# Patient Record
Sex: Male | Born: 1995 | Hispanic: No | Marital: Single | Smoking: Current every day smoker
Health system: Southern US, Community
[De-identification: ages and names within clinical notes are randomized; demographics above are authoritative.]

## PROBLEM LIST (undated history)

## (undated) HISTORY — PX: WISDOM TOOTH EXTRACTION: SHX21

---

## 2018-03-05 ENCOUNTER — Encounter (HOSPITAL_COMMUNITY): Payer: Self-pay

## 2018-03-05 ENCOUNTER — Ambulatory Visit (HOSPITAL_COMMUNITY)
Admission: EM | Admit: 2018-03-05 | Discharge: 2018-03-05 | Disposition: A | Discharge status: Home / Self Care | Payer: Medicaid Other | Attending: Family Medicine | Admitting: Family Medicine

## 2018-03-05 DIAGNOSIS — J00 Acute nasopharyngitis [common cold]: Secondary | ICD-10-CM

## 2018-03-05 MED ORDER — TRIAMCINOLONE ACETONIDE 55 MCG/ACT NA AERO: 2.0000 | INHALATION_SPRAY | Freq: Every day | NASAL | 0 refills | Status: DC

## 2018-03-05 NOTE — Discharge Instructions (Addendum)
Drink plenty of fluids Use Nasacort for the sinus and nasal congestion Expect improvement in a couple days

## 2018-03-05 NOTE — ED Provider Notes (Signed)
[facility]    CSN: [id number] Arrival date & time: 03/05/18  1758     History   Chief Complaint Chief Complaint  Patient presents with  . Fever  . Nasal Congestion    HPI Randall Hendrix is a 23 y.o. male.   HPI  Patient has a cold since yesterday.  Runny and stuffy nose.  Some headache.  No fever chills.  No cough.  No sinus pressure pain.  Clear runny nose drainage.  He tried Tylenol which did not help.  He is here because he said he did not go to work yesterday today and he needs a work note.  History reviewed. No pertinent past medical history.  There are no active problems to display for this patient.   History reviewed. No pertinent surgical history.     Home Medications    Prior to Admission medications   Medication Sig Start Date End Date Taking? Authorizing Provider  triamcinolone (NASACORT) 55 MCG/ACT AERO nasal inhaler Place 2 sprays into the nose daily. 03/05/18   Eustace Moore, MD    Family History Family History  Family history unknown: Yes    Social History Social History   Tobacco Use  . Smoking status: Current Every Day Smoker  . Smokeless tobacco: Current User  Substance Use Topics  . Alcohol use: Never    Frequency: Never  . Drug use: Never     Allergies   Patient has no known allergies.   Review of Systems Review of Systems  Constitutional: Negative for chills and fever.  HENT: Positive for congestion, postnasal drip and rhinorrhea. Negative for ear pain and sore throat.   Eyes: Negative for pain and visual disturbance.  Respiratory: Negative for cough and shortness of breath.   Cardiovascular: Negative for chest pain and palpitations.  Gastrointestinal: Negative for abdominal pain and vomiting.  Genitourinary: Negative for dysuria and hematuria.  Musculoskeletal: Negative for arthralgias and back pain.  Skin: Negative for color change and rash.  Neurological: Negative for seizures and syncope.  All other  systems reviewed and are negative.    Physical Exam Triage Vital Signs ED Triage Vitals  Enc Vitals Group     BP 03/05/18 1817 129/65     Pulse Rate 03/05/18 1817 69     Resp 03/05/18 1817 18     Temp 03/05/18 1817 98.2 F (36.8 C)     Temp Source 03/05/18 1817 Skin     SpO2 03/05/18 1817 100 %     Weight --      Height --      Head Circumference --      Peak Flow --      Pain Score 03/05/18 1818 7     Pain Loc --      Pain Edu? --      Excl. in GC? --    No data found.  Updated Vital Signs BP 129/65 (BP Location: Right Arm)   Pulse 69   Temp 98.2 F (36.8 C) (Skin)   Resp 18   SpO2 100%   Visual Acuity Right Eye Distance:   Left Eye Distance:   Bilateral Distance:    Right Eye Near:   Left Eye Near:    Bilateral Near:     Physical Exam Constitutional:      General: He is not in acute distress.    Appearance: He is well-developed.  HENT:     Head: Normocephalic and atraumatic.     Right Ear:  Tympanic membrane, ear canal and external ear normal.     Left Ear: Tympanic membrane, ear canal and external ear normal.     Nose: Congestion and rhinorrhea present.     Comments: Clear rhinorrhea    Mouth/Throat:     Mouth: Mucous membranes are moist.     Pharynx: Oropharynx is clear. No posterior oropharyngeal erythema.  Eyes:     Conjunctiva/sclera: Conjunctivae normal.     Pupils: Pupils are equal, round, and reactive to light.  Neck:     Musculoskeletal: Normal range of motion.  Cardiovascular:     Rate and Rhythm: Normal rate and regular rhythm.     Heart sounds: Normal heart sounds.  Pulmonary:     Effort: Pulmonary effort is normal. No respiratory distress.     Breath sounds: Normal breath sounds.  Abdominal:     General: There is no distension.     Palpations: Abdomen is soft.  Musculoskeletal: Normal range of motion.  Lymphadenopathy:     Cervical: No cervical adenopathy.  Skin:    General: Skin is warm and dry.  Neurological:     Mental  Status: He is alert.  Psychiatric:        Mood and Affect: Mood normal.        Behavior: Behavior normal.      UC Treatments / Results  Labs (all labs ordered are listed, but only abnormal results are displayed) Labs Reviewed - No data to display  EKG None  Radiology No results found.  Procedures Procedures (including critical care time)  Medications Ordered in UC Medications - No data to display  Initial Impression / Assessment and Plan / UC Course  I have reviewed the triage vital signs and the nursing notes.  Pertinent labs & imaging results that were available during my care of the patient were reviewed by me and considered in my medical decision making (see chart for details).      Final Clinical Impressions(s) / UC Diagnoses   Final diagnoses:  Common cold virus     Discharge Instructions     Drink plenty of fluids Use Nasacort for the sinus and nasal congestion Expect improvement in a couple days    ED Prescriptions    Medication Sig Dispense Auth. Provider   triamcinolone (NASACORT) 55 MCG/ACT AERO nasal inhaler Place 2 sprays into the nose daily. 1 Inhaler Eustace Moore, MD     Controlled Substance Prescriptions [place] Controlled Substance Registry consulted? Not Applicable   Eustace Moore, MD 03/05/18 1950

## 2018-03-05 NOTE — ED Triage Notes (Signed)
Pt present fever and nasal drainage, symptoms started yesterday. Pt states he tried OTC medication with no relief

## 2018-08-03 ENCOUNTER — Emergency Department (HOSPITAL_COMMUNITY): Payer: Medicaid Other

## 2018-08-03 ENCOUNTER — Encounter (HOSPITAL_COMMUNITY): Payer: Self-pay

## 2018-08-03 ENCOUNTER — Other Ambulatory Visit: Payer: Self-pay

## 2018-08-03 ENCOUNTER — Emergency Department (HOSPITAL_COMMUNITY)
Admission: EM | Admit: 2018-08-03 | Discharge: 2018-08-03 | Disposition: A | Discharge status: Home / Self Care | Payer: Medicaid Other | Attending: Emergency Medicine | Admitting: Emergency Medicine

## 2018-08-03 DIAGNOSIS — M7918 Myalgia, other site: Secondary | ICD-10-CM

## 2018-08-03 DIAGNOSIS — S60222A Contusion of left hand, initial encounter: Secondary | ICD-10-CM | POA: Insufficient documentation

## 2018-08-03 DIAGNOSIS — Y929 Unspecified place or not applicable: Secondary | ICD-10-CM | POA: Diagnosis not present

## 2018-08-03 DIAGNOSIS — Z79899 Other long term (current) drug therapy: Secondary | ICD-10-CM | POA: Diagnosis not present

## 2018-08-03 DIAGNOSIS — M79642 Pain in left hand: Secondary | ICD-10-CM

## 2018-08-03 DIAGNOSIS — S6992XA Unspecified injury of left wrist, hand and finger(s), initial encounter: Secondary | ICD-10-CM | POA: Diagnosis present

## 2018-08-03 DIAGNOSIS — Y999 Unspecified external cause status: Secondary | ICD-10-CM | POA: Diagnosis not present

## 2018-08-03 DIAGNOSIS — F17290 Nicotine dependence, other tobacco product, uncomplicated: Secondary | ICD-10-CM | POA: Diagnosis not present

## 2018-08-03 DIAGNOSIS — M25551 Pain in right hip: Secondary | ICD-10-CM | POA: Insufficient documentation

## 2018-08-03 DIAGNOSIS — Y9389 Activity, other specified: Secondary | ICD-10-CM | POA: Diagnosis not present

## 2018-08-03 DIAGNOSIS — W228XXA Striking against or struck by other objects, initial encounter: Secondary | ICD-10-CM | POA: Diagnosis not present

## 2018-08-03 NOTE — Discharge Instructions (Addendum)
You have been diagnosed today with musculoskeletal right hip and left hand pain.  At this time there does not appear to be the presence of an emergent medical condition, however there is always the potential for conditions to change. Please read and follow the below instructions.  Please return to the Emergency Department immediately for any new or worsening symptoms. Please be sure to follow up with your Primary Care Provider within one week regarding your visit today; please call their office to schedule an appointment even if you are feeling better for a follow-up visit. You may use over-the-counter Tylenol and ibuprofen as directed on the packaging to help with your musculoskeletal pain.  Please use rest, ice and elevation to help with your symptoms.  Your x-rays today were reassuring however unseen fractures may be present, additionally sprains and strains of ligaments and tendons may also be present.  If your symptoms do not improve within the next few days please call the orthopedic specialist Dr. Lorin Mercy to schedule a follow-up appointment for further evaluation as further imaging may be necessary.   Get help right away if: Your hand becomes warm, red, or swollen. Your hand is numb or tingling. Your hand is extremely swollen or deformed. Your hand or fingers turn white or blue. You cannot move your hand, wrist, or fingers. You fall. You have a sudden increase in pain and swelling in your hip. Your hip is red or swollen or very tender to touch. Any new/concerning or worsening symptoms.  Please read the additional information packets attached to your discharge summary.
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
## 2018-08-10 ENCOUNTER — Ambulatory Visit: Payer: Medicaid Other | Admitting: Orthopaedic Surgery

## 2018-08-17 ENCOUNTER — Ambulatory Visit: Payer: Medicaid Other | Admitting: Orthopaedic Surgery

## 2018-11-04 ENCOUNTER — Other Ambulatory Visit: Payer: Self-pay

## 2018-11-04 DIAGNOSIS — Z20822 Contact with and (suspected) exposure to covid-19: Secondary | ICD-10-CM

## 2018-11-07 LAB — NOVEL CORONAVIRUS, NAA: SARS-CoV-2, NAA: NOT DETECTED

## 2019-09-22 ENCOUNTER — Ambulatory Visit (HOSPITAL_COMMUNITY)
Admission: RE | Admit: 2019-09-22 | Discharge: 2019-09-22 | Disposition: A | Discharge status: Home / Self Care | Payer: Medicaid Other | Source: Ambulatory Visit | Attending: Family Medicine | Admitting: Family Medicine

## 2019-09-22 ENCOUNTER — Encounter (HOSPITAL_COMMUNITY): Payer: Self-pay

## 2019-09-22 ENCOUNTER — Other Ambulatory Visit: Payer: Self-pay

## 2019-09-22 VITALS — BP 112/56 | HR 77 | Temp 98.7°F | Resp 16

## 2019-09-22 DIAGNOSIS — H6503 Acute serous otitis media, bilateral: Secondary | ICD-10-CM

## 2019-09-22 MED ORDER — PREDNISONE 50 MG PO TABS: ORAL_TABLET | ORAL | 0 refills | Status: DC

## 2019-09-22 NOTE — Discharge Instructions (Signed)
If your ear pressure and pain does not resolve or continues to come back, you may try flonase or nasacort nasal spray 1-2 times daily as needed to help

## 2019-09-22 NOTE — ED Triage Notes (Signed)
Pt c/o bilateral ear pain x 3 days. Pt states it is a sharp shooting pain and is worse when he is lying down.

## 2019-09-22 NOTE — ED Provider Notes (Signed)
[facility]    CSN: [id number] Arrival date & time: 09/22/19  1745      History   Chief Complaint Chief Complaint  Patient presents with  . Appointment  . Otalgia    HPI Randall Hendrix is a 24 y.o. male.   Here today with several days of worsening ear pressure and sharp shooting intermittent pains. Denies muffled hearing, congestion, sore throat, fever, chills. Notes this has happened occasionally to him in the past, unsure what the trigger is or what he's done to resolve the issue. Denies hx of seasonal allergies or past ear infections. Not trying anything at home for sxs.      History reviewed. No pertinent past medical history.  There are no problems to display for this patient.   Past Surgical History:  Procedure Laterality Date  . WISDOM TOOTH EXTRACTION         Home Medications    Prior to Admission medications   Medication Sig Start Date End Date Taking? Authorizing Provider  predniSONE (DELTASONE) 50 MG tablet Take 1 tablet daily with breakfast for 3 days 09/22/19   Particia Nearing, PA-C  triamcinolone (NASACORT) 55 MCG/ACT AERO nasal inhaler Place 2 sprays into the nose daily. 03/05/18   Eustace Moore, MD    Family History Family History  Family history unknown: Yes    Social History Social History   Tobacco Use  . Smoking status: Current Every Day Smoker    Packs/day: 0.50    Types: Cigarettes  . Smokeless tobacco: Current User  Vaping Use  . Vaping Use: Former  Substance Use Topics  . Alcohol use: Never  . Drug use: Never     Allergies   Patient has no known allergies.   Review of Systems Review of Systems PER HPI   Physical Exam Triage Vital Signs ED Triage Vitals  Enc Vitals Group     BP 09/22/19 1822 (!) 112/56     Pulse Rate 09/22/19 1822 77     Resp 09/22/19 1822 16     Temp 09/22/19 1822 98.7 F (37.1 C)     Temp Source 09/22/19 1822 Oral     SpO2 09/22/19 1822 98 %     Weight --      Height  --      Head Circumference --      Peak Flow --      Pain Score 09/22/19 1818 7     Pain Loc --      Pain Edu? --      Excl. in GC? --    No data found.  Updated Vital Signs BP (!) 112/56 (BP Location: Left Arm)   Pulse 77   Temp 98.7 F (37.1 C) (Oral)   Resp 16   SpO2 98%   Visual Acuity Right Eye Distance:   Left Eye Distance:   Bilateral Distance:    Right Eye Near:   Left Eye Near:    Bilateral Near:     Physical Exam Vitals and nursing note reviewed.  Constitutional:      Appearance: Normal appearance.  HENT:     Head: Atraumatic.     Right Ear: Ear canal normal.     Left Ear: Ear canal normal.     Ears:     Comments: B/l middle ear effusions, no TM erythema, injection, purulent fluid    Nose: Nose normal. No congestion.     Mouth/Throat:     Mouth: Mucous membranes are  moist.     Pharynx: Oropharynx is clear. No posterior oropharyngeal erythema.  Eyes:     Extraocular Movements: Extraocular movements intact.     Conjunctiva/sclera: Conjunctivae normal.  Cardiovascular:     Rate and Rhythm: Normal rate and regular rhythm.  Pulmonary:     Effort: Pulmonary effort is normal.     Breath sounds: Normal breath sounds.  Musculoskeletal:        General: Normal range of motion.     Cervical back: Normal range of motion and neck supple.  Skin:    General: Skin is warm and dry.  Neurological:     General: No focal deficit present.     Mental Status: He is oriented to person, place, and time.  Psychiatric:        Mood and Affect: Mood normal.        Thought Content: Thought content normal.        Judgment: Judgment normal.      UC Treatments / Results  Labs (all labs ordered are listed, but only abnormal results are displayed) Labs Reviewed - No data to display  EKG   Radiology No results found.  Procedures Procedures (including critical care time)  Medications Ordered in UC Medications - No data to display  Initial Impression / Assessment  and Plan / UC Course  I have reviewed the triage vital signs and the nursing notes.  Pertinent labs & imaging results that were available during my care of the patient were reviewed by me and considered in my medical decision making (see chart for details).     Suspect eustachian tube dysfunction, will tx with short prednisone burst and start flonase twice daily. Follow up if issue worsens or does not resolve.   Final Clinical Impressions(s) / UC Diagnoses   Final diagnoses:  Non-recurrent acute serous otitis media of both ears     Discharge Instructions     If your ear pressure and pain does not resolve or continues to come back, you may try flonase or nasacort nasal spray 1-2 times daily as needed to help    ED Prescriptions    Medication Sig Dispense Auth. Provider   predniSONE (DELTASONE) 50 MG tablet Take 1 tablet daily with breakfast for 3 days 3 tablet Particia Nearing, [place]     PDMP not reviewed this encounter.   Particia Nearing, [place] 09/22/19 1850

## 2020-01-24 ENCOUNTER — Emergency Department (HOSPITAL_COMMUNITY)
Admission: EM | Admit: 2020-01-24 | Discharge: 2020-01-24 | Disposition: A | Discharge status: Home / Self Care | Payer: Medicaid Other | Attending: Emergency Medicine | Admitting: Emergency Medicine

## 2020-01-24 ENCOUNTER — Encounter (HOSPITAL_COMMUNITY): Payer: Self-pay

## 2020-01-24 ENCOUNTER — Other Ambulatory Visit: Payer: Self-pay

## 2020-01-24 DIAGNOSIS — M5489 Other dorsalgia: Secondary | ICD-10-CM | POA: Diagnosis not present

## 2020-01-24 DIAGNOSIS — Z5321 Procedure and treatment not carried out due to patient leaving prior to being seen by health care provider: Secondary | ICD-10-CM | POA: Diagnosis not present

## 2020-01-24 NOTE — ED Notes (Signed)
Called with no answer 

## 2020-01-24 NOTE — ED Triage Notes (Addendum)
Pt presents with c/o right upper back pain around the scapula area. Pt denies any injury leading to the pain. Pt reports pain is 8/10. Pt reports pain started 3 days ago.

## 2020-08-28 ENCOUNTER — Encounter (HOSPITAL_COMMUNITY): Payer: Self-pay

## 2020-08-28 ENCOUNTER — Emergency Department (HOSPITAL_COMMUNITY)
Admission: EM | Admit: 2020-08-28 | Discharge: 2020-08-28 | Disposition: A | Discharge status: Home / Self Care | Payer: Medicaid Other | Attending: Emergency Medicine | Admitting: Emergency Medicine

## 2020-08-28 ENCOUNTER — Other Ambulatory Visit: Payer: Self-pay

## 2020-08-28 DIAGNOSIS — Y9389 Activity, other specified: Secondary | ICD-10-CM | POA: Diagnosis not present

## 2020-08-28 DIAGNOSIS — F17210 Nicotine dependence, cigarettes, uncomplicated: Secondary | ICD-10-CM | POA: Diagnosis not present

## 2020-08-28 DIAGNOSIS — W268XXA Contact with other sharp object(s), not elsewhere classified, initial encounter: Secondary | ICD-10-CM | POA: Insufficient documentation

## 2020-08-28 DIAGNOSIS — Y990 Civilian activity done for income or pay: Secondary | ICD-10-CM | POA: Diagnosis not present

## 2020-08-28 DIAGNOSIS — S6992XA Unspecified injury of left wrist, hand and finger(s), initial encounter: Secondary | ICD-10-CM | POA: Diagnosis present

## 2020-08-28 DIAGNOSIS — Z23 Encounter for immunization: Secondary | ICD-10-CM | POA: Insufficient documentation

## 2020-08-28 DIAGNOSIS — S61012A Laceration without foreign body of left thumb without damage to nail, initial encounter: Secondary | ICD-10-CM | POA: Diagnosis not present

## 2020-08-28 MED ORDER — TETANUS-DIPHTH-ACELL PERTUSSIS 5-2.5-18.5 LF-MCG/0.5 IM SUSY
0.5000 mL | PREFILLED_SYRINGE | Freq: Once | INTRAMUSCULAR | Status: AC
  Administered 2020-08-28: 0.5 mL via INTRAMUSCULAR
  Filled 2020-08-28: qty 0.5

## 2020-08-28 MED ORDER — LIDOCAINE HCL (PF) 1 % IJ SOLN
10.0000 mL | Freq: Once | INTRAMUSCULAR | Status: AC
  Administered 2020-08-28: 10 mL
  Filled 2020-08-28: qty 30

## 2020-08-28 MED ORDER — BACITRACIN ZINC 500 UNIT/GM EX OINT
TOPICAL_OINTMENT | Freq: Two times a day (BID) | CUTANEOUS | Status: DC
  Administered 2020-08-28: 1 via TOPICAL
  Filled 2020-08-28: qty 0.9

## 2020-08-28 NOTE — ED Provider Notes (Signed)
[facility]-EMERGENCY DEPT Provider Note   CSN: [id number] Arrival date & time: 08/28/20  0524     History Chief Complaint  Patient presents with   Thumb Laceration    Randall Hendrix is a 25 y.o. 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 The patient pulled the thumb away quickly and sheared the area. He denies pain with movement of the extremity and reports a mild numbness at the site of injury. The patient does not know when his last tetanus shot was.  HPI     History reviewed. No pertinent past medical history.  There are no problems to display for this patient.   Past Surgical History:  Procedure Laterality Date   WISDOM TOOTH EXTRACTION         Family History  Family history unknown: Yes    Social History   Tobacco Use   Smoking status: Every Day    Packs/day: 0.50    Types: Cigarettes   Smokeless tobacco: Current  Vaping Use   Vaping Use: Former  Substance Use Topics   Alcohol use: Never   Drug use: Never    Home Medications Prior to Admission medications   Medication Sig Start Date End Date Taking? Authorizing Provider  predniSONE (DELTASONE) 50 MG tablet Take 1 tablet daily with breakfast for 3 days 09/22/19   Particia Nearing, PA-C  triamcinolone (NASACORT) 55 MCG/ACT AERO nasal inhaler Place 2 sprays into the nose daily. 03/05/18   Eustace Moore, MD    Allergies    Patient has no known allergies.  Review of Systems   Review of Systems  Constitutional:  Negative for fever.  Musculoskeletal:  Negative for joint swelling.  Skin:  Positive for wound.  Hematological:  Does not bruise/bleed easily.  All other systems reviewed and are negative.  Physical Exam Updated Vital Signs BP (!) 153/72 (BP Location: Left Arm)   Pulse 85   Temp 97.8 F (36.6 C) (Oral)   Resp 18   SpO2 99%   Physical Exam Vitals  and nursing note reviewed.  Constitutional:      General: He is not in acute distress.    Appearance: Normal appearance.  HENT:     Head: Normocephalic and atraumatic.  Eyes:     General:        Right eye: No discharge.        Left eye: No discharge.  Cardiovascular:     Rate and Rhythm: Normal rate and regular rhythm.     Heart sounds: No murmur heard.   No friction rub. No gallop.  Pulmonary:     Effort: Pulmonary effort is normal.     Breath sounds: Normal breath sounds.  Musculoskeletal:     Comments: Passive and active ROM of left thumb grossly intact. Strength of thumb intact, 5/5.   Skin:    General: Skin is warm and dry.     Capillary Refill: Capillary refill takes less than 2 seconds.     Comments: 2 cm laceration on the thumb which is linear and well approximated  Neurological:     Mental Status: He is alert and oriented to person, place, and time.     Sensory: No sensory deficit.  Psychiatric:        Mood and Affect: Mood normal.        Behavior: Behavior normal.  ED Results / Procedures / Treatments   Labs (all labs ordered are listed, but only abnormal results are displayed) Labs Reviewed - No data to display  EKG None  Radiology No results found.  Procedures .Marland KitchenLaceration Repair  Date/Time: 08/28/2020 6:28 AM Performed by: Olene Floss, PA-C Authorized by: Olene Floss, PA-C   Consent:    Consent obtained:  Verbal   Consent given by:  Patient   Risks, benefits, and alternatives were discussed: yes     Risks discussed:  Infection, poor cosmetic result and pain   Alternatives discussed:  No treatment Universal protocol:    Procedure explained and questions answered to patient or proxy's satisfaction: yes     Patient identity confirmed:  Verbally with patient Anesthesia:    Anesthesia method:  Nerve block   Block location:  Ring block left thumb   Block needle gauge:  25 G   Block anesthetic:  Lidocaine 1% w/o epi   Block  injection procedure:  Anatomic landmarks identified, introduced needle and negative aspiration for blood   Block outcome:  Anesthesia achieved Laceration details:    Location: ventral left thumb.   Length (cm):  2   Depth (mm):  2 Exploration:    Hemostasis achieved with:  Direct pressure   Wound exploration: entire depth of wound visualized     Contaminated: no   Treatment:    Area cleansed with:  Saline   Amount of cleaning:  Standard   Irrigation solution:  Sterile saline   Debridement:  None   Undermining:  None Skin repair:    Repair method:  Sutures   Suture size:  4-0   Suture material:  Prolene   Suture technique:  Simple interrupted   Number of sutures:  3 Approximation:    Approximation:  Close Repair type:    Repair type:  Simple Post-procedure details:    Dressing:  Non-adherent dressing   Procedure completion:  Tolerated   Medications Ordered in ED Medications  lidocaine (PF) (XYLOCAINE) 1 % injection 10 mL (has no administration in time range)  Tdap (BOOSTRIX) injection 0.5 mL (has no administration in time range)    ED Course  I have reviewed the triage vital signs and the nursing notes.  Pertinent labs & imaging results that were available during my care of the patient were reviewed by me and considered in my medical decision making (see chart for details).    MDM Rules/Calculators/A&P                         Wound fully visualized and no foreign bodies retained. Patient with full ROM and neurovascularly intact. Lac repaired as detailed in the note above. Hemostasis achieved with direct pressure and sutures. Bacitracin and bandage applied. Tetanus updated. Final Clinical Impression(s) / ED Diagnoses Final diagnoses:  None    Rx / DC Orders ED Discharge Orders     None        [place] 08/28/20 0881    Marily Memos, MD 08/29/20 [phone]

## 2020-08-28 NOTE — ED Notes (Signed)
Wound irrigation provided via sterile saline and cleanser. 4x4 Gauze applied to wound site.

## 2020-08-28 NOTE — Discharge Instructions (Signed)
Please return in 10-14 for suture removal. Please return for re-evaluation if the wound becomes hot, exquisitely tender, or has pus draining from the site. Keep bandaged +/- antibiotic ointment especially while at work.

## 2020-08-28 NOTE — ED Triage Notes (Signed)
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

## 2020-08-28 NOTE — ED Provider Notes (Signed)
Pt seen in conjunction with C Prosperi, PA-C. Please see his notes for full history, exam, and plan  In brief, patient presented for evaluation of left palm laceration which occurred just prior to arrival.  Tetanus is not up-to-date.  On exam, he is neurovascularly intact. Repaired with sutures.   Alveria Apley, PA-C 08/28/20 1855    Marily Memos, MD 08/28/20 [phone]

## 2020-08-28 NOTE — ED Notes (Signed)
PT ambulatory in ED lobby. 

## 2020-09-03 ENCOUNTER — Encounter (HOSPITAL_BASED_OUTPATIENT_CLINIC_OR_DEPARTMENT_OTHER): Payer: Self-pay

## 2020-09-04 ENCOUNTER — Ambulatory Visit: Payer: Medicaid Other | Admitting: Family Medicine

## 2020-10-15 ENCOUNTER — Ambulatory Visit (HOSPITAL_BASED_OUTPATIENT_CLINIC_OR_DEPARTMENT_OTHER): Payer: Medicaid Other | Admitting: Family Medicine
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
## 2021-05-18 ENCOUNTER — Other Ambulatory Visit: Payer: Self-pay

## 2021-05-18 ENCOUNTER — Emergency Department (HOSPITAL_BASED_OUTPATIENT_CLINIC_OR_DEPARTMENT_OTHER)
Admission: EM | Admit: 2021-05-18 | Discharge: 2021-05-19 | Disposition: A | Discharge status: Home / Self Care | Payer: Medicaid Other | Attending: Emergency Medicine | Admitting: Emergency Medicine

## 2021-05-18 DIAGNOSIS — K644 Residual hemorrhoidal skin tags: Secondary | ICD-10-CM | POA: Diagnosis not present

## 2021-05-18 DIAGNOSIS — R0789 Other chest pain: Secondary | ICD-10-CM

## 2021-05-18 DIAGNOSIS — L03115 Cellulitis of right lower limb: Secondary | ICD-10-CM | POA: Insufficient documentation

## 2021-05-18 DIAGNOSIS — J302 Other seasonal allergic rhinitis: Secondary | ICD-10-CM

## 2021-05-18 DIAGNOSIS — K649 Unspecified hemorrhoids: Secondary | ICD-10-CM

## 2021-05-18 DIAGNOSIS — K6289 Other specified diseases of anus and rectum: Secondary | ICD-10-CM | POA: Diagnosis present

## 2021-05-19 ENCOUNTER — Other Ambulatory Visit: Payer: Self-pay

## 2021-05-19 ENCOUNTER — Encounter (HOSPITAL_BASED_OUTPATIENT_CLINIC_OR_DEPARTMENT_OTHER): Payer: Self-pay | Admitting: *Deleted

## 2021-05-19 ENCOUNTER — Emergency Department (HOSPITAL_BASED_OUTPATIENT_CLINIC_OR_DEPARTMENT_OTHER): Payer: Medicaid Other | Admitting: Radiology

## 2021-05-19 MED ORDER — DOXYCYCLINE HYCLATE 100 MG PO CAPS: 100.0000 mg | ORAL_CAPSULE | Freq: Two times a day (BID) | ORAL | 0 refills | Status: AC

## 2021-05-19 NOTE — ED Triage Notes (Addendum)
Pt c/o of right anterior chest pain that started one day ago. States fevers on and off times one week. Describes as a sharp pain. States pain worse with cough. Has been taking benadryl without relief. Pt also c/o rectal pain/swelling to rectal area that started today. States pain in rectal area worse with cough. Denies any sob.  ?

## 2021-05-19 NOTE — ED Notes (Signed)
Pt requesting MD return to eval R leg -- concerned for abscess to RLE that he feels might need to be drained - Dr Bernette Mayers notified via secure chat; awaiting reply  ?

## 2021-05-19 NOTE — ED Notes (Signed)
Dr Karle Starch back to bedside to re-eval patient re: pt concern for possible abscess -- mild redness noted to upper medial thigh region with small ingrown hair -- provider to update d/c instructions to include ABX prescription.  ?

## 2021-05-19 NOTE — ED Notes (Signed)
Pt agreeable with d/c plan as discussed by provider- this nurse has verbally reinforced d/c instructions and provided pt with written copy- pt acknowledges verbal understanding and denies any additional questions, concerns, needs- ambulatory at d/c independently with steady gait; no distress; vitals stable.  ?

## 2021-05-19 NOTE — ED Notes (Signed)
Patient transported to X-ray via w/c escorted by Radiology tech  ?

## 2021-05-19 NOTE — ED Provider Notes (Addendum)
? ?[facility] EMERGENCY DEPT  ?Provider Note ? ?CSN: [id number] ?Arrival date & time: 05/18/21 2354 ? ?History ?Chief Complaint  ?Patient presents with  ? Rectal Pain  ? ? ?Randall Hendrix is a 26 y.o. male here for two complaints. His first is that he has been having rectal pain for the last 12 hours or so. He had a normal BM yesterday. Has not had a BM today. No bleeding, no hard stools. Pain is worse when coughing.  ? ?He also reports he has had seasonal allergies with a cough recently. No fevers but he has been feeling generally weak. He reports some pain in chest which is worse with coughing.  ? ? ?Home Medications ?Prior to Admission medications   ?Medication Sig Start Date End Date Taking? Authorizing Provider  ?doxycycline (VIBRAMYCIN) 100 MG capsule Take 1 capsule (100 mg total) by mouth 2 (two) times daily. 05/19/21  Yes Pollyann Savoy, MD  ? ? ? ?Allergies    ?Patient has no known allergies. ? ? ?Review of Systems   ?Review of Systems ?Please see HPI for pertinent positives and negatives ? ?Physical Exam ?BP 124/72   Pulse 70   Temp 98.4 ?F (36.9 ?C) (Oral)   Resp (!) 21   Ht 5\' 11"  (1.803 m)   Wt 76.2 kg   SpO2 98%   BMI 23.43 kg/m?  ? ?Physical Exam ?Vitals and nursing note reviewed.  ?Constitutional:   ?   Appearance: Normal appearance.  ?HENT:  ?   Head: Normocephalic and atraumatic.  ?   Nose: Nose normal.  ?   Mouth/Throat:  ?   Mouth: Mucous membranes are moist.  ?Eyes:  ?   Extraocular Movements: Extraocular movements intact.  ?   Conjunctiva/sclera: Conjunctivae normal.  ?Cardiovascular:  ?   Rate and Rhythm: Normal rate.  ?Pulmonary:  ?   Effort: Pulmonary effort is normal.  ?   Breath sounds: Normal breath sounds.  ?Chest:  ?   Chest wall: Tenderness (R chest wall) present.  ?Abdominal:  ?   General: Abdomen is flat.  ?   Palpations: Abdomen is soft.  ?   Tenderness: There is no abdominal tenderness.  ?Genitourinary: ?   Comments: Chaperon present. There is a moderate R  sided non-thrombosed non-bleeding external hemorrhoid, no fissure, no fluctuant mass/abscess.  ?Musculoskeletal:     ?   General: No swelling. Normal range of motion.  ?   Cervical back: Neck supple.  ?Skin: ?   General: Skin is warm and dry.  ?Neurological:  ?   General: No focal deficit present.  ?   Mental Status: He is alert.  ?Psychiatric:     ?   Mood and Affect: Mood normal.  ? ? ?ED Results / Procedures / Treatments   ?EKG ?EKG Interpretation ? ?Date/Time:  Sunday May 19 2021 00:23:56 EDT ?Ventricular Rate:  75 ?PR Interval:  162 ?QRS Duration: 92 ?QT Interval:  388 ?QTC Calculation: 434 ?R Axis:   203 ?Text Interpretation: Sinus rhythm ST elev, probable normal early repol pattern No old tracing to compare Confirmed by [id number]) on 05/19/2021 12:35:36 AM ? ?Procedures ?Procedures ? ?Medications Ordered in the ED ?Medications - No data to display ? ?Initial Impression and Plan ? Patient here with rectal pain from a hemorrhoid. Will treat conservatively with OTC ointments, stool softeners and outpatient follow up.  ?For his cough with chest pain, check EKG and CXR. Symptoms most consistent with a MSK problem, no concern  for ACS in this healthy 25 year old.  ? ?ED Course  ? ?Clinical Course as of 05/19/21 0130  ?Wynelle Link May 19, 2021  ?0104 I personally viewed the images from radiology studies and agree with radiologist interpretation: CXR is clear. Will d/c with information regarding hemorrhoid management. PCP follow up. Continue with symptomatic treatments for reported allergies.  ? [CS]  ?0128 Prior to discharge patient now reporting a boil on his R inner thigh he failed to mention before. There is a small pustule, too small to drain, with surrounding erythema. Will add doxycycline for cellulitis.  [CS]  ?  ?Clinical Course User Index ?[CS] Pollyann Savoy, MD  ? ? ? ?MDM Rules/Calculators/A&P ?Medical Decision Making ?Problems Addressed: ?Cellulitis of right thigh: acute illness or injury ?Chest  wall pain: acute illness or injury ?Hemorrhoids, unspecified hemorrhoid type: acute illness or injury ?Seasonal allergies: acute illness or injury ? ?Amount and/or Complexity of Data Reviewed ?Radiology: ordered and independent interpretation performed. Decision-making details documented in ED Course. ?ECG/medicine tests: ordered and independent interpretation performed. Decision-making details documented in ED Course. ? ?Risk ?OTC drugs. ?Prescription drug management. ? ? ? ?Final Clinical Impression(s) / ED Diagnoses ?Final diagnoses:  ?Hemorrhoids, unspecified hemorrhoid type  ?Chest wall pain  ?Seasonal allergies  ?Cellulitis of right thigh  ? ? ?Rx / DC Orders ?ED Discharge Orders   ? ?      Ordered  ?  doxycycline (VIBRAMYCIN) 100 MG capsule  2 times daily       ? 05/19/21 0129  ? ?  ?  ? ?  ? ?  ?Pollyann Savoy, MD ?05/19/21 0117 ? ?  ?Pollyann Savoy, MD ?05/19/21 0130 ? ?

## 2021-05-19 NOTE — ED Notes (Signed)
Late entry-- Pt returned from xray via w/c  ?

## 2022-05-27 ENCOUNTER — Ambulatory Visit: Payer: Medicaid Other | Admitting: Nurse Practitioner

## 2022-06-17 ENCOUNTER — Ambulatory Visit: Payer: Self-pay | Admitting: Nurse Practitioner

## 2022-07-01 ENCOUNTER — Ambulatory Visit: Payer: Self-pay | Admitting: Nurse Practitioner

## 2023-09-01 IMAGING — DX DG CHEST 2V
2 series · 2 of 2 positions shown · non-contrast
Comparison: None Available.

CLINICAL DATA: Right-sided anterior chest pain.

EXAM:
CHEST - 2 VIEW

[chest pa]
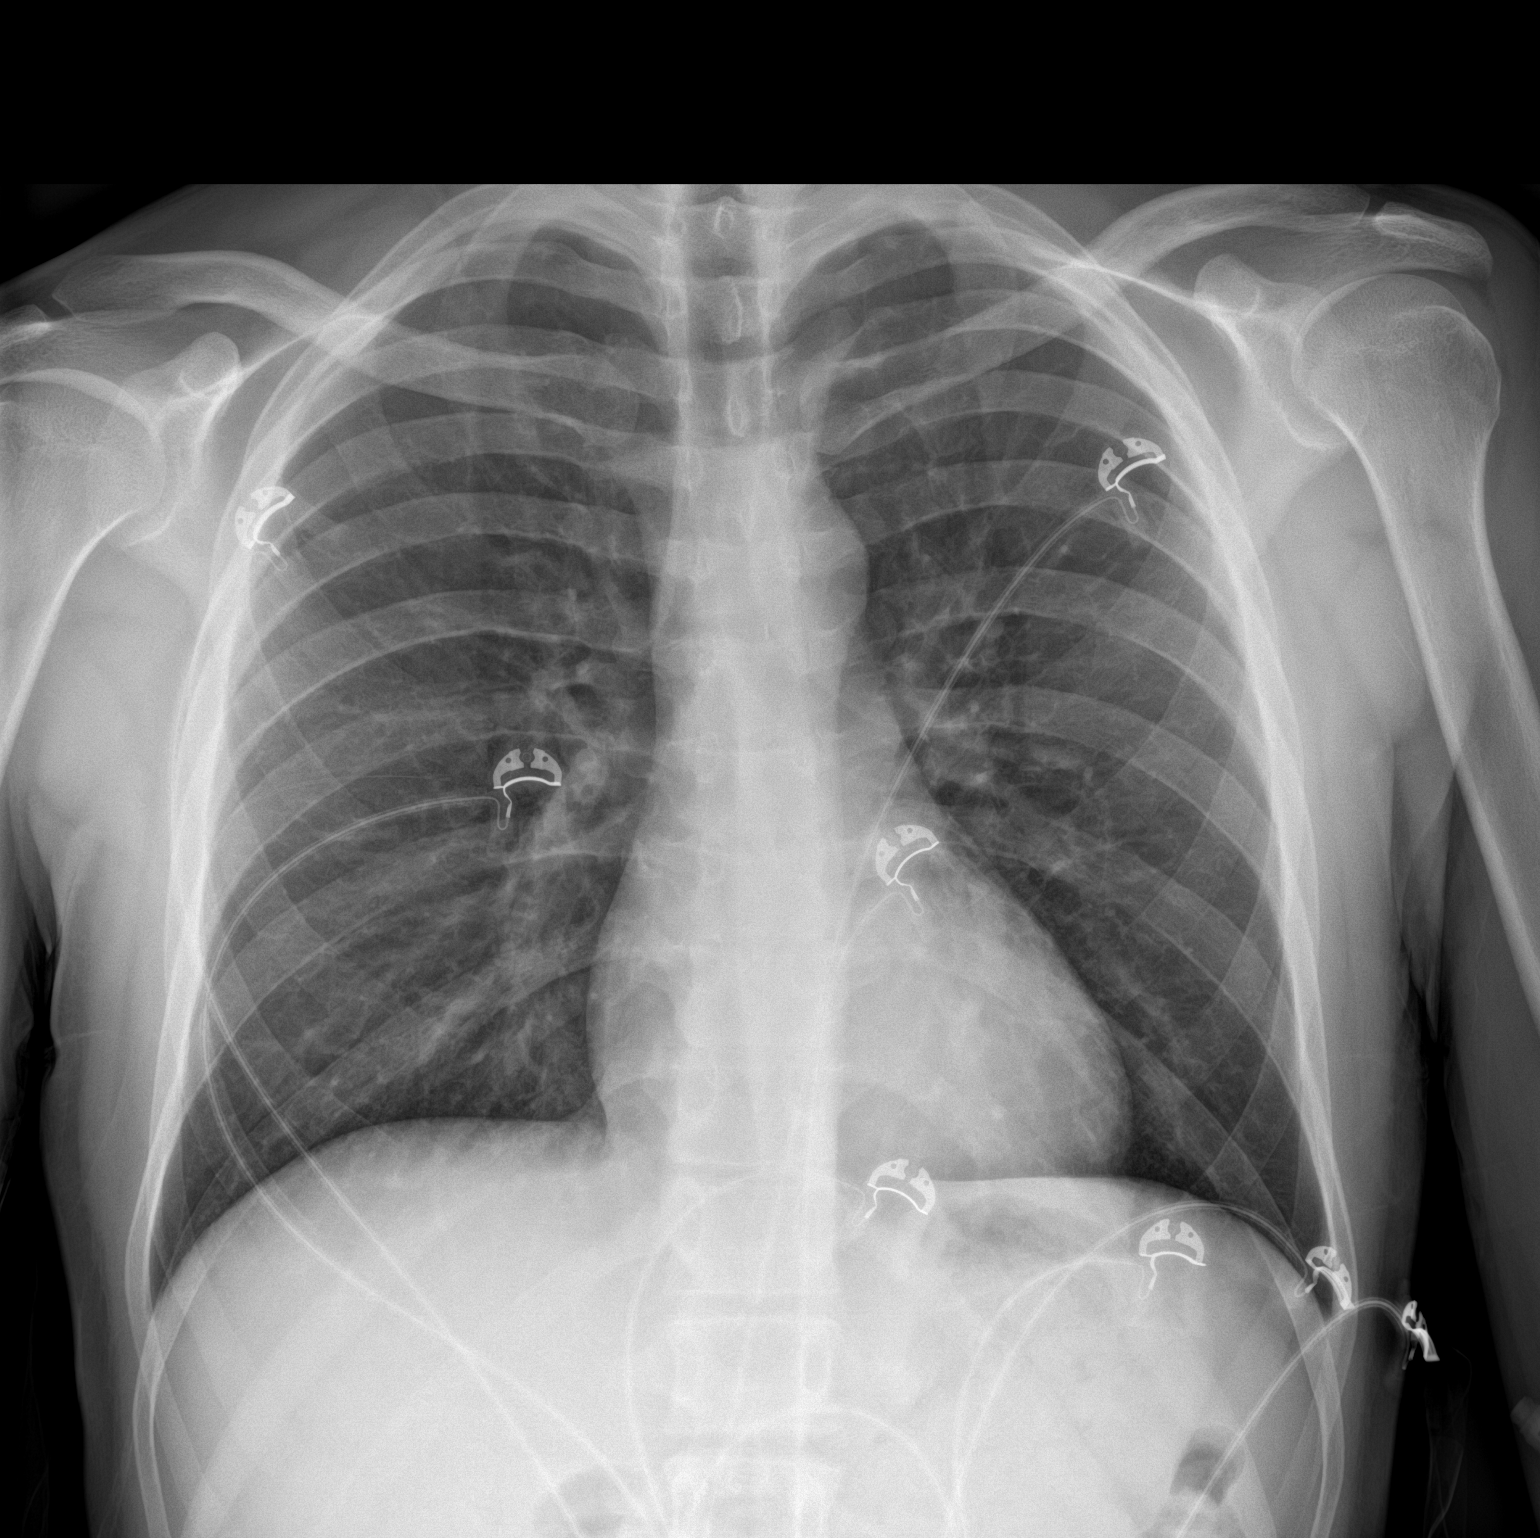

[chest lat]
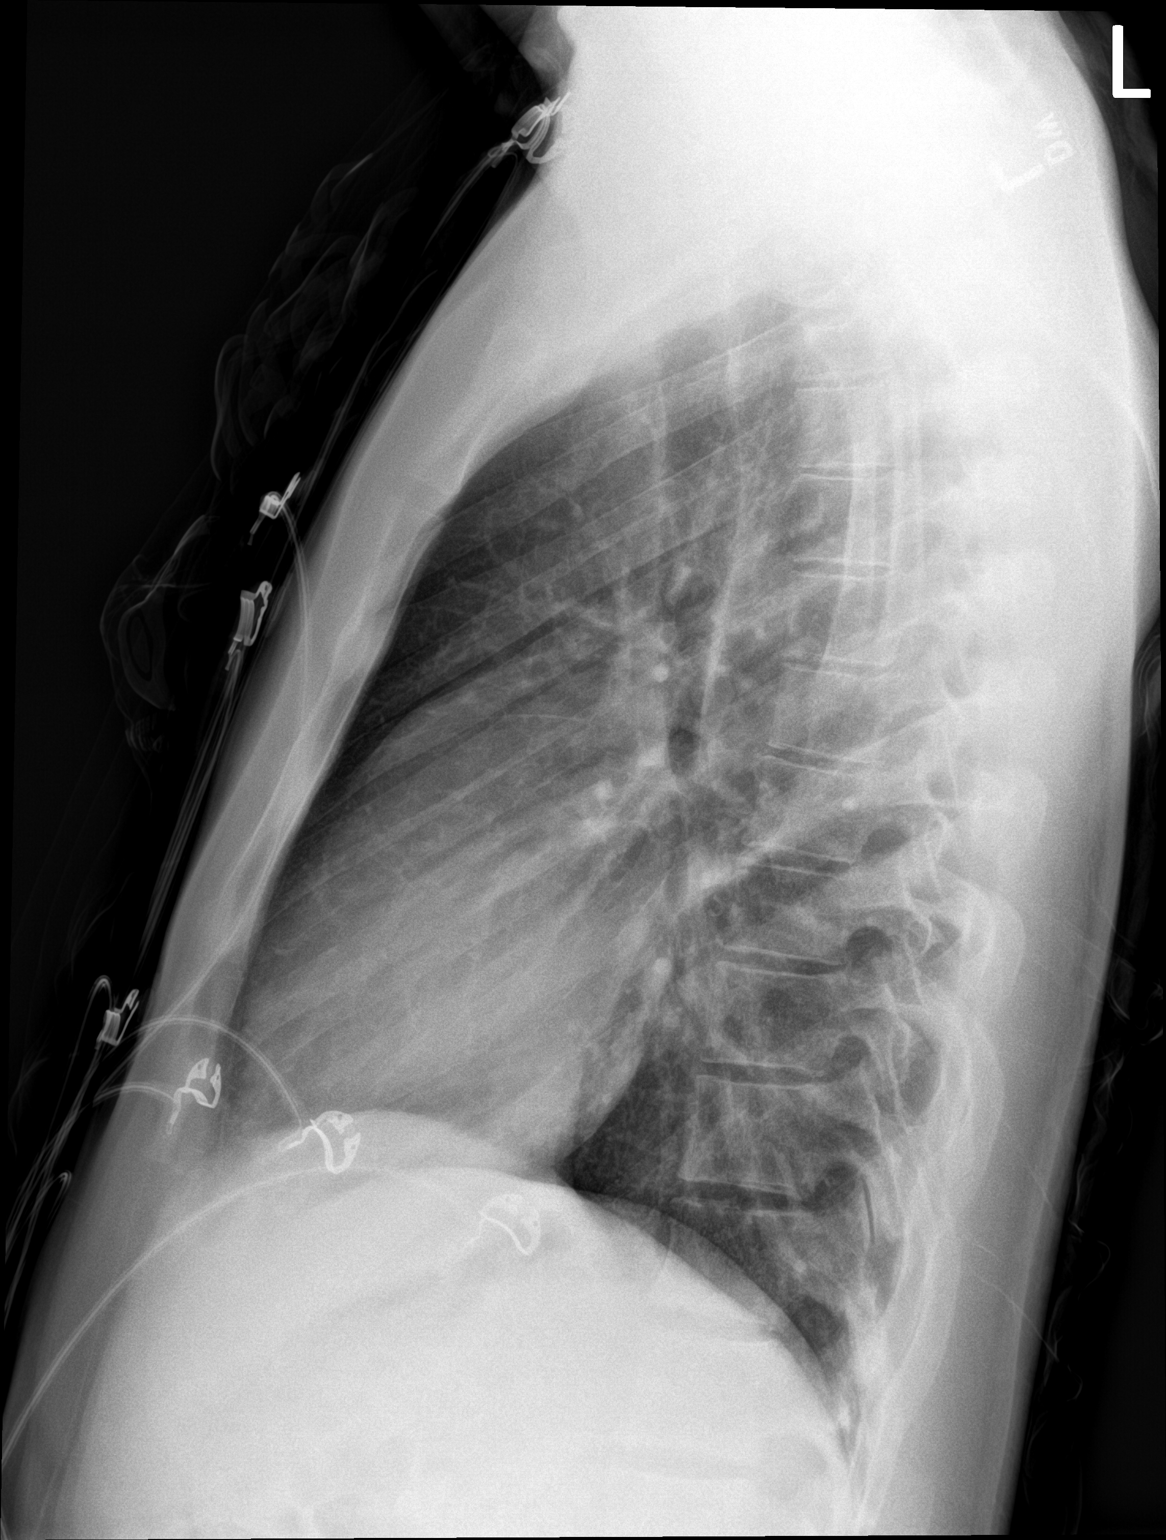

[2 of 2 positions shown; findings below may reference images not displayed]

FINDINGS: The heart size and mediastinal contours are within normal limits.
Both lungs are clear. The visualized skeletal structures are
unremarkable.
IMPRESSION: No active cardiopulmonary disease.
